# Patient Record
Sex: Male | Born: 2000 | Race: Black or African American | Hispanic: No | Marital: Single
Health system: Southern US, Community
[De-identification: ages and names within clinical notes are randomized; demographics above are authoritative.]

---
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
## 2020-02-19 ENCOUNTER — Ambulatory Visit (HOSPITAL_COMMUNITY): Payer: Self-pay

## 2020-07-09 ENCOUNTER — Emergency Department (HOSPITAL_COMMUNITY)
Admission: EM | Admit: 2020-07-09 | Discharge: 2020-08-08 | Disposition: E | Payer: Medicaid Other | Attending: Emergency Medicine | Admitting: Emergency Medicine

## 2020-07-09 DIAGNOSIS — R4020 Unspecified coma: Secondary | ICD-10-CM | POA: Insufficient documentation

## 2020-07-09 DIAGNOSIS — I469 Cardiac arrest, cause unspecified: Secondary | ICD-10-CM

## 2020-07-09 DIAGNOSIS — R1110 Vomiting, unspecified: Secondary | ICD-10-CM | POA: Insufficient documentation

## 2020-07-09 DIAGNOSIS — R001 Bradycardia, unspecified: Secondary | ICD-10-CM | POA: Diagnosis not present

## 2020-07-09 LAB — I-STAT CHEM 8, ED
BUN: 16 mg/dL (ref 6–20)
Calcium, Ion: 0.92 mmol/L — ABNORMAL LOW (ref 1.15–1.40)
Chloride: 101 mmol/L (ref 98–111)
Creatinine, Ser: 1.4 mg/dL — ABNORMAL HIGH (ref 0.61–1.24)
Glucose, Bld: 241 mg/dL — ABNORMAL HIGH (ref 70–99)
HCT: 45 % (ref 39.0–52.0)
Hemoglobin: 15.3 g/dL (ref 13.0–17.0)
Potassium: 5.4 mmol/L — ABNORMAL HIGH (ref 3.5–5.1)
Sodium: 136 mmol/L (ref 135–145)
TCO2: 22 mmol/L (ref 22–32)

## 2020-07-09 LAB — CBG MONITORING, ED: Glucose-Capillary: 93 mg/dL (ref 70–99)

## 2020-08-08 NOTE — ED Triage Notes (Signed)
Pt BIB [facility] from home. Per girlfriend on scene, her and patient were sitting on couch watching TV, pt stood up quickly saying, "I don't feel good." Pt then vomited x1.upon EMS arrival pt with agonal respirations, began assisting with ventilations, pt went into PEA of 40 upon being placed in truck.   King airway placed cap 40 Left tib/fib I/O placed  Received 300cc NS  CBG 154

## 2020-08-08 NOTE — Progress Notes (Signed)
   2020-07-11 1555  Clinical Encounter Type  Visited With Patient and family together;Health care provider  Visit Type Initial;Death;ED  Referral From Chaplain  Consult/Referral To Chaplain  Spiritual Encounters  Spiritual Needs Emotional;Prayer;Grief support   Mom: Cuthbert Turton, [phone]  This Chaplain relieved Chaplain Ray. Pt's mom, Katrina, and her former partner were present when Dr. Wilkie Aye informed them of Pt's passing. Chaplain provided grief and spiritual support and engaged active listening. When Pt's aunt, Randa Evens, arrived, Chaplain took them back to bedside. Chaplain gave Pt Placement card to Pt's mom. No funeral home had been chosen yet, but [facility] was mentioned. Chaplain remains available as needed.   This note was prepared by Chaplain Resident, Tacy Learn, MDiv. Chaplain remains available as needed through the on-call pager: [phone].

## 2020-08-08 NOTE — Code Documentation (Addendum)
CPR in process on arrival.  1531-Epi,  1532-PEA 1532 1533-PEA  1535-Epi,,,,Bicarb 1538-Epi 1540-PEA 1540-Calcium 1541-Epi-HR 150 Shock given 1542-shock given 1544-300mg  Amiodarone  1545-16 Fr NGT 1545-V-Fib shock given  1547-Epi, Shock given 1549-PEA 1549-150mg  Amiodarone  1551-shock given 1553 shock given 1553-Epi 1554-PEA 1557-PEA  TOD 1557

## 2020-08-08 NOTE — ED Notes (Signed)
Pt transferred to purple zone for family viewing.

## 2020-08-08 NOTE — ED Provider Notes (Signed)
[facility] EMERGENCY DEPARTMENT Provider Note   CSN: [id number] Arrival date & time:        History Chief Complaint  Patient presents with  . CPR    Jeff Wilkins is a 20 y.o. male.  HPI   20 year old male with no known past medical history presents the emergency department active CPR in process.  Report from EMS is that the patient was otherwise in his baseline status, sitting on the couch watching TV with his girlfriend when he stated that he did not feel well.  He reportedly stood up, collapsed on the floor, had an episode of emesis.  EMS reports when they got there his fingerstick was appropriate, he was unresponsive but protecting his airway.  When they got to the truck he started to bradycardia down with heart rates into the 30s and they lost pulses.  CPR was initiated.  A left IO was placed.  A King tube was placed.  On arrival active CPR.  No past medical history on file.  There are no problems to display for this patient.   The histories are not reviewed yet. Please review them in the "History" navigator section and refresh this SmartLink.     No family history on file.     Home Medications Prior to Admission medications   Not on File    Allergies    Patient has no allergy information on record.  Review of Systems   Review of Systems  Unable to perform ROS: Intubated    Physical Exam Updated Vital Signs SpO2 (!) 0%   Physical Exam Vitals and nursing note reviewed.  Constitutional:      Comments: Unresponsive  HENT:     Head: Normocephalic.     Nose:     Comments: Nasal airway in place    Mouth/Throat:     Comments: King airway with tube securer in place Eyes:     Conjunctiva/sclera: Conjunctivae normal.     Pupils: Pupils are equal, round, and reactive to light.  Cardiovascular:     Comments: Pulseless, active CPR Pulmonary:     Comments: King airway in place Abdominal:     General: There is distension.   Musculoskeletal:        General: No swelling or deformity.  Skin:    General: Skin is warm.  Neurological:     Comments: Unresponsive     ED Results / Procedures / Treatments   Labs (all labs ordered are listed, but only abnormal results are displayed) Labs Reviewed  I-STAT CHEM 8, ED - Abnormal; Notable for the following components:      Result Value   Potassium 5.4 (*)    Creatinine, Ser 1.40 (*)    Glucose, Bld 241 (*)    Calcium, Ion 0.92 (*)    All other components within normal limits  CBG MONITORING, ED    EKG None  Radiology No results found.  Procedures .Critical Care Performed by: Rozelle Logan, DO Authorized by: Rozelle Logan, DO   Critical care provider statement:    Critical care time (minutes):  45   Critical care was necessary to treat or prevent imminent or life-threatening deterioration of the following conditions:  Cardiac failure and respiratory failure   Critical care was time spent personally by me on the following activities:  Discussions with consultants, evaluation of patient's response to treatment, examination of patient, ordering and performing treatments and interventions, ordering and review of laboratory studies, ordering and  review of radiographic studies, pulse oximetry, re-evaluation of patient's condition, obtaining history from patient or surrogate and review of old charts   I assumed direction of critical care for this patient from another provider in my specialty: no   CPR  Date/Time: 07/10/2020 4:38 PM Performed by: Rozelle Logan, DO Authorized by: Rozelle Logan, DO  CPR Procedure Details:      Amount of time prior to administration of ACLS/BLS (minutes):  15   ACLS/BLS initiated by EMS: Yes     CPR/ACLS performed in the ED: Yes     Duration of CPR (minutes):  30   Outcome: Pt declared dead    CPR performed via ACLS guidelines under my direct supervision.  See RN documentation for details including defibrillator  use, medications, doses and timing.     Medications Ordered in ED Medications - No data to display  ED Course  I have reviewed the triage vital signs and the nursing notes.  Pertinent labs & imaging results that were available during my care of the patient were reviewed by me and considered in my medical decision making (see chart for details).    MDM Rules/Calculators/A&P                          20 year old male arrived unresponsive with active CPR.  King airway in place, equal bilateral breath sounds in the apices, oxygen saturation greater than 95%.  Fingerstick appropriate on arrival.  No obvious signs of trauma, no report of possible drug use, no toxidrome consistent with opiate overdose.  Multiple rounds of CPR were performed, patient was in PEA.    Multiple rounds of epinephrine, bicarb and calcium were given.  Patient was PEA with 1 rhythm change at 1547 into fine V. fib which was shocked.  He remained in fine V. fib for 3 rounds until he returned to PEA.  Abdomen became distended, NG tube was placed to decompress the abdomen, frank bloody fluid suctioned out.  On multiple reevaluations patient continued to have bilateral breath sounds in the apices, oxygen saturation remained over 95% with bagging.  On bedside ultrasound there was no free fluid in the abdomen, no pericardial effusion, no cardiac activity with pulse checks.  1557 patient was pronounced dead.  Bedside ultrasound showed cardiac standstill.  Mother was notified here at the hospital with chaplain at bedside.  MD Dr. Hyacinth Meeker has accepted the case.  Final Clinical Impression(s) / ED Diagnoses Final diagnoses:  None    Rx / DC Orders ED Discharge Orders    None       Rozelle Logan, DO 07-10-20 1641

## 2020-08-08 DEATH — deceased
